# Patient Record
Sex: Female | Born: 1980
Health system: Southern US, Community
[De-identification: ages and names within clinical notes are randomized; demographics above are authoritative.]

## PROBLEM LIST (undated history)

## (undated) DIAGNOSIS — I471 Supraventricular tachycardia, unspecified: Secondary | ICD-10-CM

## (undated) DIAGNOSIS — G8929 Other chronic pain: Secondary | ICD-10-CM

## (undated) DIAGNOSIS — I10 Essential (primary) hypertension: Secondary | ICD-10-CM

## (undated) DIAGNOSIS — E282 Polycystic ovarian syndrome: Secondary | ICD-10-CM

## (undated) DIAGNOSIS — Z9889 Other specified postprocedural states: Secondary | ICD-10-CM

## (undated) DIAGNOSIS — R112 Nausea with vomiting, unspecified: Secondary | ICD-10-CM

## (undated) HISTORY — DX: Essential (primary) hypertension: I10

## (undated) HISTORY — DX: Other chronic pain: G89.29

---
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
## 2014-03-09 ENCOUNTER — Other Ambulatory Visit (HOSPITAL_COMMUNITY)
Admission: RE | Admit: 2014-03-09 | Discharge: 2014-03-09 | Disposition: A | Discharge status: Home / Self Care | Payer: BC Managed Care – PPO | Source: Ambulatory Visit | Attending: Obstetrics & Gynecology | Admitting: Obstetrics & Gynecology

## 2014-03-09 DIAGNOSIS — Z1151 Encounter for screening for human papillomavirus (HPV): Secondary | ICD-10-CM | POA: Insufficient documentation

## 2014-03-09 DIAGNOSIS — Z01419 Encounter for gynecological examination (general) (routine) without abnormal findings: Secondary | ICD-10-CM | POA: Insufficient documentation

## 2014-04-21 ENCOUNTER — Other Ambulatory Visit: Payer: Self-pay | Admitting: Family Medicine

## 2014-04-21 DIAGNOSIS — M545 Low back pain, unspecified: Secondary | ICD-10-CM

## 2014-05-01 ENCOUNTER — Other Ambulatory Visit: Payer: BC Managed Care – PPO

## 2014-05-08 ENCOUNTER — Ambulatory Visit
Admission: RE | Admit: 2014-05-08 | Discharge: 2014-05-08 | Disposition: A | Discharge status: Home / Self Care | Payer: BC Managed Care – PPO | Source: Ambulatory Visit | Attending: Family Medicine | Admitting: Family Medicine

## 2014-05-08 DIAGNOSIS — M545 Low back pain, unspecified: Secondary | ICD-10-CM

## 2019-07-13 ENCOUNTER — Other Ambulatory Visit: Payer: Self-pay | Admitting: Obstetrics & Gynecology

## 2019-07-13 DIAGNOSIS — Z1231 Encounter for screening mammogram for malignant neoplasm of breast: Secondary | ICD-10-CM

## 2019-08-30 ENCOUNTER — Other Ambulatory Visit: Payer: Self-pay

## 2019-08-30 ENCOUNTER — Ambulatory Visit
Admission: RE | Admit: 2019-08-30 | Discharge: 2019-08-30 | Disposition: A | Discharge status: Home / Self Care | Payer: BC Managed Care – PPO | Source: Ambulatory Visit | Attending: Obstetrics & Gynecology | Admitting: Obstetrics & Gynecology

## 2019-08-30 DIAGNOSIS — Z1231 Encounter for screening mammogram for malignant neoplasm of breast: Secondary | ICD-10-CM

## 2019-08-31 ENCOUNTER — Other Ambulatory Visit: Payer: Self-pay | Admitting: Obstetrics & Gynecology

## 2019-08-31 DIAGNOSIS — R928 Other abnormal and inconclusive findings on diagnostic imaging of breast: Secondary | ICD-10-CM

## 2019-09-02 ENCOUNTER — Ambulatory Visit
Admission: RE | Admit: 2019-09-02 | Discharge: 2019-09-02 | Disposition: A | Discharge status: Home / Self Care | Payer: BC Managed Care – PPO | Source: Ambulatory Visit | Attending: Obstetrics & Gynecology | Admitting: Obstetrics & Gynecology

## 2019-09-02 ENCOUNTER — Other Ambulatory Visit: Payer: Self-pay | Admitting: Obstetrics & Gynecology

## 2019-09-02 ENCOUNTER — Other Ambulatory Visit: Payer: Self-pay

## 2019-09-02 DIAGNOSIS — R928 Other abnormal and inconclusive findings on diagnostic imaging of breast: Secondary | ICD-10-CM

## 2019-09-02 DIAGNOSIS — N6489 Other specified disorders of breast: Secondary | ICD-10-CM

## 2019-09-07 ENCOUNTER — Ambulatory Visit
Admission: RE | Admit: 2019-09-07 | Discharge: 2019-09-07 | Disposition: A | Discharge status: Home / Self Care | Payer: BC Managed Care – PPO | Source: Ambulatory Visit | Attending: Obstetrics & Gynecology | Admitting: Obstetrics & Gynecology

## 2019-09-07 ENCOUNTER — Other Ambulatory Visit: Payer: Self-pay

## 2019-09-07 DIAGNOSIS — N6489 Other specified disorders of breast: Secondary | ICD-10-CM

## 2019-09-07 HISTORY — PX: BREAST BIOPSY: SHX20

## 2019-10-26 ENCOUNTER — Other Ambulatory Visit: Payer: Self-pay | Admitting: General Surgery

## 2019-10-26 DIAGNOSIS — N6489 Other specified disorders of breast: Secondary | ICD-10-CM

## 2019-10-27 ENCOUNTER — Other Ambulatory Visit: Payer: Self-pay | Admitting: General Surgery

## 2019-10-27 DIAGNOSIS — N6489 Other specified disorders of breast: Secondary | ICD-10-CM

## 2019-11-22 ENCOUNTER — Encounter (HOSPITAL_BASED_OUTPATIENT_CLINIC_OR_DEPARTMENT_OTHER): Payer: Self-pay | Admitting: General Surgery

## 2019-11-22 ENCOUNTER — Other Ambulatory Visit: Payer: Self-pay

## 2019-11-25 ENCOUNTER — Other Ambulatory Visit (HOSPITAL_COMMUNITY)
Admission: RE | Admit: 2019-11-25 | Discharge: 2019-11-25 | Disposition: A | Discharge status: Home / Self Care | Payer: BC Managed Care – PPO | Source: Ambulatory Visit | Attending: General Surgery | Admitting: General Surgery

## 2019-11-25 DIAGNOSIS — Z01812 Encounter for preprocedural laboratory examination: Secondary | ICD-10-CM | POA: Diagnosis present

## 2019-11-25 DIAGNOSIS — Z20822 Contact with and (suspected) exposure to covid-19: Secondary | ICD-10-CM | POA: Diagnosis not present

## 2019-11-25 LAB — SARS CORONAVIRUS 2 (TAT 6-24 HRS): SARS Coronavirus 2: NEGATIVE

## 2019-11-25 NOTE — Progress Notes (Signed)
Anesthesia consult with Dr. Okey Dupre. Ok to proceed with surgery as scheduled.

## 2019-11-25 NOTE — Progress Notes (Signed)
      Enhanced Recovery after Surgery for Orthopedics Enhanced Recovery after Surgery is a protocol used to improve the stress on your body and your recovery after surgery.  Patient Instructions  . The night before surgery:  o No food after midnight. ONLY clear liquids after midnight  . The day of surgery (if you do NOT have diabetes):  o Drink ONE (1) Pre-Surgery Clear Ensure as directed.   o This drink was given to you during your hospital  pre-op appointment visit. o The pre-op nurse will instruct you on the time to drink the  Pre-Surgery Ensure depending on your surgery time. o Finish the drink at the designated time by the pre-op nurse.  o Nothing else to drink after completing the  Pre-Surgery Clear Ensure.  . The day of surgery (if you have diabetes): o Drink ONE (1) Gatorade 2 (G2) as directed. o This drink was given to you during your hospital  pre-op appointment visit.  o The pre-op nurse will instruct you on the time to drink the   Gatorade 2 (G2) depending on your surgery time. o Color of the Gatorade may vary. Red is not allowed. o Nothing else to drink after completing the  Gatorade 2 (G2).         If you have questions, please contact your surgeon's office.  Hibiclens given to pt with instructions. 

## 2019-11-28 ENCOUNTER — Other Ambulatory Visit: Payer: Self-pay

## 2019-11-28 ENCOUNTER — Ambulatory Visit
Admission: RE | Admit: 2019-11-28 | Discharge: 2019-11-28 | Disposition: A | Discharge status: Home / Self Care | Payer: BC Managed Care – PPO | Source: Ambulatory Visit | Attending: General Surgery | Admitting: General Surgery

## 2019-11-28 DIAGNOSIS — N6489 Other specified disorders of breast: Secondary | ICD-10-CM

## 2019-11-29 ENCOUNTER — Encounter (HOSPITAL_BASED_OUTPATIENT_CLINIC_OR_DEPARTMENT_OTHER)
Admission: RE | Disposition: A | Discharge status: Home / Self Care | Payer: Self-pay | Source: Home / Self Care | Attending: General Surgery

## 2019-11-29 ENCOUNTER — Ambulatory Visit (HOSPITAL_BASED_OUTPATIENT_CLINIC_OR_DEPARTMENT_OTHER): Payer: BC Managed Care – PPO | Admitting: Anesthesiology

## 2019-11-29 ENCOUNTER — Ambulatory Visit
Admission: RE | Admit: 2019-11-29 | Discharge: 2019-11-29 | Disposition: A | Discharge status: Home / Self Care | Payer: BC Managed Care – PPO | Source: Ambulatory Visit | Attending: General Surgery | Admitting: General Surgery

## 2019-11-29 ENCOUNTER — Ambulatory Visit (HOSPITAL_BASED_OUTPATIENT_CLINIC_OR_DEPARTMENT_OTHER)
Admission: RE | Admit: 2019-11-29 | Discharge: 2019-11-29 | Disposition: A | Discharge status: Home / Self Care | Payer: BC Managed Care – PPO | Attending: General Surgery | Admitting: General Surgery

## 2019-11-29 ENCOUNTER — Encounter (HOSPITAL_BASED_OUTPATIENT_CLINIC_OR_DEPARTMENT_OTHER): Payer: Self-pay | Admitting: General Surgery

## 2019-11-29 ENCOUNTER — Other Ambulatory Visit: Payer: Self-pay

## 2019-11-29 DIAGNOSIS — N6489 Other specified disorders of breast: Secondary | ICD-10-CM | POA: Insufficient documentation

## 2019-11-29 DIAGNOSIS — Z6841 Body Mass Index (BMI) 40.0 and over, adult: Secondary | ICD-10-CM | POA: Diagnosis not present

## 2019-11-29 HISTORY — DX: Supraventricular tachycardia, unspecified: I47.10

## 2019-11-29 HISTORY — DX: Other specified postprocedural states: R11.2

## 2019-11-29 HISTORY — DX: Supraventricular tachycardia: I47.1

## 2019-11-29 HISTORY — DX: Polycystic ovarian syndrome: E28.2

## 2019-11-29 HISTORY — PX: BREAST EXCISIONAL BIOPSY: SUR124

## 2019-11-29 HISTORY — DX: Other specified postprocedural states: Z98.890

## 2019-11-29 HISTORY — PX: RADIOACTIVE SEED GUIDED EXCISIONAL BREAST BIOPSY: SHX6490

## 2019-11-29 LAB — POCT PREGNANCY, URINE: Preg Test, Ur: NEGATIVE

## 2019-11-29 SURGERY — RADIOACTIVE SEED GUIDED BREAST BIOPSY
Anesthesia: General | Site: Breast | Laterality: Right

## 2019-11-29 MED ORDER — KETOROLAC TROMETHAMINE 15 MG/ML IJ SOLN
INTRAMUSCULAR | Status: AC
  Filled 2019-11-29: qty 1

## 2019-11-29 MED ORDER — FENTANYL CITRATE (PF) 100 MCG/2ML IJ SOLN
INTRAMUSCULAR | Status: AC
  Filled 2019-11-29: qty 2

## 2019-11-29 MED ORDER — ENSURE PRE-SURGERY PO LIQD: 296.0000 mL | Freq: Once | ORAL | Status: DC

## 2019-11-29 MED ORDER — TRAMADOL HCL 50 MG PO TABS: 50.0000 mg | ORAL_TABLET | Freq: Four times a day (QID) | ORAL | 0 refills | Status: DC | PRN

## 2019-11-29 MED ORDER — CEFAZOLIN SODIUM-DEXTROSE 2-4 GM/100ML-% IV SOLN
2.0000 g | INTRAVENOUS | Status: AC
  Administered 2019-11-29: 3 g via INTRAVENOUS

## 2019-11-29 MED ORDER — HYDROMORPHONE HCL 1 MG/ML IJ SOLN
INTRAMUSCULAR | Status: AC
  Filled 2019-11-29: qty 0.5

## 2019-11-29 MED ORDER — DEXAMETHASONE SODIUM PHOSPHATE 10 MG/ML IJ SOLN
INTRAMUSCULAR | Status: AC
  Filled 2019-11-29: qty 1

## 2019-11-29 MED ORDER — OXYCODONE HCL 5 MG PO TABS: 5.0000 mg | ORAL_TABLET | Freq: Once | ORAL | Status: DC | PRN

## 2019-11-29 MED ORDER — KETOROLAC TROMETHAMINE 15 MG/ML IJ SOLN
15.0000 mg | INTRAMUSCULAR | Status: AC
  Administered 2019-11-29: 15 mg via INTRAVENOUS

## 2019-11-29 MED ORDER — ACETAMINOPHEN 500 MG PO TABS
1000.0000 mg | ORAL_TABLET | ORAL | Status: AC
  Administered 2019-11-29: 1000 mg via ORAL

## 2019-11-29 MED ORDER — LIDOCAINE 2% (20 MG/ML) 5 ML SYRINGE
INTRAMUSCULAR | Status: AC
  Filled 2019-11-29: qty 5

## 2019-11-29 MED ORDER — PROMETHAZINE HCL 25 MG/ML IJ SOLN: 6.2500 mg | INTRAMUSCULAR | Status: DC | PRN

## 2019-11-29 MED ORDER — LIDOCAINE 2% (20 MG/ML) 5 ML SYRINGE
INTRAMUSCULAR | Status: DC | PRN
  Administered 2019-11-29: 80 mg via INTRAVENOUS

## 2019-11-29 MED ORDER — ONDANSETRON HCL 4 MG/2ML IJ SOLN
INTRAMUSCULAR | Status: AC
  Filled 2019-11-29: qty 2

## 2019-11-29 MED ORDER — GABAPENTIN 100 MG PO CAPS
100.0000 mg | ORAL_CAPSULE | ORAL | Status: AC
  Administered 2019-11-29: 100 mg via ORAL

## 2019-11-29 MED ORDER — CEFAZOLIN SODIUM-DEXTROSE 1-4 GM/50ML-% IV SOLN
INTRAVENOUS | Status: AC
  Filled 2019-11-29: qty 50

## 2019-11-29 MED ORDER — METHYLENE BLUE 0.5 % INJ SOLN
INTRAVENOUS | Status: AC
  Filled 2019-11-29: qty 20

## 2019-11-29 MED ORDER — BUPIVACAINE HCL (PF) 0.25 % IJ SOLN
INTRAMUSCULAR | Status: DC | PRN
  Administered 2019-11-29: 14 mL

## 2019-11-29 MED ORDER — SODIUM CHLORIDE (PF) 0.9 % IJ SOLN
INTRAMUSCULAR | Status: AC
  Filled 2019-11-29: qty 20

## 2019-11-29 MED ORDER — TRAMADOL HCL 50 MG PO TABS
ORAL_TABLET | ORAL | Status: AC
  Filled 2019-11-29: qty 1

## 2019-11-29 MED ORDER — DEXAMETHASONE SODIUM PHOSPHATE 10 MG/ML IJ SOLN
INTRAMUSCULAR | Status: DC | PRN
  Administered 2019-11-29: 4 mg via INTRAVENOUS

## 2019-11-29 MED ORDER — PROPOFOL 10 MG/ML IV BOLUS
INTRAVENOUS | Status: DC | PRN
  Administered 2019-11-29: 200 mg via INTRAVENOUS

## 2019-11-29 MED ORDER — EPHEDRINE SULFATE 50 MG/ML IJ SOLN
INTRAMUSCULAR | Status: DC | PRN
  Administered 2019-11-29: 10 mg via INTRAVENOUS
  Administered 2019-11-29: 15 mg via INTRAVENOUS

## 2019-11-29 MED ORDER — MIDAZOLAM HCL 5 MG/5ML IJ SOLN
INTRAMUSCULAR | Status: DC | PRN
  Administered 2019-11-29: 2 mg via INTRAVENOUS

## 2019-11-29 MED ORDER — FENTANYL CITRATE (PF) 250 MCG/5ML IJ SOLN
INTRAMUSCULAR | Status: DC | PRN
  Administered 2019-11-29 (×2): 25 ug via INTRAVENOUS

## 2019-11-29 MED ORDER — MIDAZOLAM HCL 2 MG/2ML IJ SOLN
INTRAMUSCULAR | Status: AC
  Filled 2019-11-29: qty 2

## 2019-11-29 MED ORDER — LACTATED RINGERS IV SOLN: INTRAVENOUS | Status: DC

## 2019-11-29 MED ORDER — ONDANSETRON HCL 4 MG/2ML IJ SOLN
INTRAMUSCULAR | Status: DC | PRN
  Administered 2019-11-29: 4 mg via INTRAVENOUS

## 2019-11-29 MED ORDER — TRAMADOL HCL 50 MG PO TABS
50.0000 mg | ORAL_TABLET | Freq: Once | ORAL | Status: AC
  Administered 2019-11-29: 11:00:00 50 mg via ORAL

## 2019-11-29 MED ORDER — CEFAZOLIN SODIUM-DEXTROSE 2-4 GM/100ML-% IV SOLN
INTRAVENOUS | Status: AC
  Filled 2019-11-29: qty 100

## 2019-11-29 MED ORDER — OXYCODONE HCL 5 MG/5ML PO SOLN: 5.0000 mg | Freq: Once | ORAL | Status: DC | PRN

## 2019-11-29 MED ORDER — BUPIVACAINE HCL (PF) 0.25 % IJ SOLN
INTRAMUSCULAR | Status: AC
  Filled 2019-11-29: qty 180

## 2019-11-29 MED ORDER — PROPOFOL 10 MG/ML IV BOLUS
INTRAVENOUS | Status: AC
  Filled 2019-11-29: qty 20

## 2019-11-29 MED ORDER — ACETAMINOPHEN 500 MG PO TABS
ORAL_TABLET | ORAL | Status: AC
  Filled 2019-11-29: qty 2

## 2019-11-29 MED ORDER — HYDROMORPHONE HCL 1 MG/ML IJ SOLN
0.2500 mg | INTRAMUSCULAR | Status: DC | PRN
  Administered 2019-11-29: 10:00:00 0.5 mg via INTRAVENOUS

## 2019-11-29 MED ORDER — GABAPENTIN 100 MG PO CAPS
ORAL_CAPSULE | ORAL | Status: AC
  Filled 2019-11-29: qty 1

## 2019-11-29 SURGICAL SUPPLY — 53 items
BINDER BREAST 3XL (GAUZE/BANDAGES/DRESSINGS) ×3 IMPLANT
BINDER BREAST XXLRG (GAUZE/BANDAGES/DRESSINGS) IMPLANT
BLADE SURG 15 STRL LF DISP TIS (BLADE) ×1 IMPLANT
BLADE SURG 15 STRL SS (BLADE) ×2
CANISTER SUC SOCK COL 7IN (MISCELLANEOUS) IMPLANT
CANISTER SUCT 1200ML W/VALVE (MISCELLANEOUS) IMPLANT
CHLORAPREP W/TINT 26 (MISCELLANEOUS) ×3 IMPLANT
CLIP VESOCCLUDE SM WIDE 6/CT (CLIP) ×3 IMPLANT
CLOSURE WOUND 1/2 X4 (GAUZE/BANDAGES/DRESSINGS) ×1
COVER BACK TABLE 60X90IN (DRAPES) ×3 IMPLANT
COVER MAYO STAND STRL (DRAPES) ×3 IMPLANT
COVER PROBE W GEL 5X96 (DRAPES) ×3 IMPLANT
DERMABOND ADVANCED (GAUZE/BANDAGES/DRESSINGS) ×2
DERMABOND ADVANCED .7 DNX12 (GAUZE/BANDAGES/DRESSINGS) ×1 IMPLANT
DRAPE LAPAROSCOPIC ABDOMINAL (DRAPES) ×3 IMPLANT
DRAPE UTILITY XL STRL (DRAPES) ×3 IMPLANT
ELECT COATED BLADE 2.86 ST (ELECTRODE) ×3 IMPLANT
ELECT REM PT RETURN 9FT ADLT (ELECTROSURGICAL) ×3
ELECTRODE REM PT RTRN 9FT ADLT (ELECTROSURGICAL) ×1 IMPLANT
GLOVE BIO SURGEON STRL SZ7 (GLOVE) ×9 IMPLANT
GLOVE BIO SURGEON STRL SZ7.5 (GLOVE) ×3 IMPLANT
GLOVE BIOGEL PI IND STRL 7.0 (GLOVE) ×1 IMPLANT
GLOVE BIOGEL PI IND STRL 7.5 (GLOVE) ×2 IMPLANT
GLOVE BIOGEL PI IND STRL 8 (GLOVE) ×1 IMPLANT
GLOVE BIOGEL PI INDICATOR 7.0 (GLOVE) ×2
GLOVE BIOGEL PI INDICATOR 7.5 (GLOVE) ×4
GLOVE BIOGEL PI INDICATOR 8 (GLOVE) ×2
GLOVE SURG SS PI 6.5 STRL IVOR (GLOVE) ×3 IMPLANT
GOWN STRL REUS W/ TWL LRG LVL3 (GOWN DISPOSABLE) ×2 IMPLANT
GOWN STRL REUS W/TWL LRG LVL3 (GOWN DISPOSABLE) ×4
HEMOSTAT ARISTA ABSORB 3G PWDR (HEMOSTASIS) IMPLANT
KIT MARKER MARGIN INK (KITS) ×3 IMPLANT
NEEDLE HYPO 25X1 1.5 SAFETY (NEEDLE) ×3 IMPLANT
NS IRRIG 1000ML POUR BTL (IV SOLUTION) ×3 IMPLANT
PACK BASIN DAY SURGERY FS (CUSTOM PROCEDURE TRAY) ×3 IMPLANT
PENCIL SMOKE EVACUATOR (MISCELLANEOUS) ×3 IMPLANT
RETRACTOR ONETRAX LX 90X20 (MISCELLANEOUS) ×3 IMPLANT
SLEEVE SCD COMPRESS KNEE MED (MISCELLANEOUS) ×3 IMPLANT
SPONGE LAP 4X18 RFD (DISPOSABLE) ×6 IMPLANT
STRIP CLOSURE SKIN 1/2X4 (GAUZE/BANDAGES/DRESSINGS) ×2 IMPLANT
SUT MNCRL AB 4-0 PS2 18 (SUTURE) ×3 IMPLANT
SUT MON AB 5-0 PS2 18 (SUTURE) IMPLANT
SUT SILK 2 0 SH (SUTURE) IMPLANT
SUT VIC AB 2-0 SH 27 (SUTURE) ×2
SUT VIC AB 2-0 SH 27XBRD (SUTURE) ×1 IMPLANT
SUT VIC AB 3-0 SH 27 (SUTURE) ×2
SUT VIC AB 3-0 SH 27X BRD (SUTURE) ×1 IMPLANT
SYR CONTROL 10ML LL (SYRINGE) ×3 IMPLANT
TOWEL GREEN STERILE FF (TOWEL DISPOSABLE) ×3 IMPLANT
TRAY FAXITRON CT DISP (TRAY / TRAY PROCEDURE) ×3 IMPLANT
TUBE CONNECTING 20'X1/4 (TUBING) ×1
TUBE CONNECTING 20X1/4 (TUBING) ×2 IMPLANT
YANKAUER SUCT BULB TIP NO VENT (SUCTIONS) ×3 IMPLANT

## 2019-11-29 NOTE — Anesthesia Postprocedure Evaluation (Signed)
Anesthesia Post Note  Patient: Regina Carlson  Procedure(s) Performed: RADIOACTIVE SEED GUIDED EXCISIONAL RIGHT BREAST BIOPSY (Right Breast)     Patient location during evaluation: PACU Anesthesia Type: General Level of consciousness: awake and alert Pain management: pain level controlled Vital Signs Assessment: post-procedure vital signs reviewed and stable Respiratory status: spontaneous breathing, nonlabored ventilation, respiratory function stable and patient connected to nasal cannula oxygen Cardiovascular status: blood pressure returned to baseline and stable Postop Assessment: no apparent nausea or vomiting Anesthetic complications: no    Last Vitals:  Vitals:   11/29/19 1024 11/29/19 1045  BP: 139/79 (!) 143/83  Pulse:  76  Resp:  16  Temp:  37.1 C  SpO2:  100%    Last Pain:  Vitals:   11/29/19 1008  TempSrc:   PainSc: 5                  Ryan P Ellender

## 2019-11-29 NOTE — Interval H&P Note (Signed)
History and Physical Interval Note:  11/29/2019 8:09 AM  Regina Carlson  has presented today for surgery, with the diagnosis of RIGHT BREAST ABNORMAL MAMMOGRAM.  The various methods of treatment have been discussed with the patient and family. After consideration of risks, benefits and other options for treatment, the patient has consented to  Procedure(s): RADIOACTIVE SEED GUIDED EXCISIONAL RIGHT BREAST BIOPSY (Right) as a surgical intervention.  The patient's history has been reviewed, patient examined, no change in status, stable for surgery.  I have reviewed the patient's chart and labs.  Questions were answered to the patient's satisfaction.     Emelia Loron

## 2019-11-29 NOTE — Anesthesia Procedure Notes (Signed)
Procedure Name: LMA Insertion Date/Time: 11/29/2019 8:32 AM Performed by: Lucinda Dell, CRNA Pre-anesthesia Checklist: Patient identified, Emergency Drugs available, Suction available and Patient being monitored Patient Re-evaluated:Patient Re-evaluated prior to induction Oxygen Delivery Method: Circle system utilized Preoxygenation: Pre-oxygenation with 100% oxygen Induction Type: IV induction Ventilation: Mask ventilation without difficulty LMA: LMA with gastric port inserted Tube type: Oral Number of attempts: 1 Placement Confirmation: positive ETCO2 and breath sounds checked- equal and bilateral Tube secured with: Tape Dental Injury: Teeth and Oropharynx as per pre-operative assessment

## 2019-11-29 NOTE — Op Note (Signed)
Preoperative diagnosis: Right breast mammographic abnormality with core biopsy showing radial scar Postoperative diagnosis: Same as above Procedure: Right breast radioactive seed guided excisional biopsy Surgeon: Dr. Harden Mo Anesthesia: General estimated blood loss: 20 cc Specimens: Right breast tissue marked with paint containing seed and clip Drains none Complications none Sponge needle count was correct at completion Disposition to recovery in stable condition  Indications: This is a 39 year old female underwent a screening mammogram.  She was found to be density breast and a distortion on the right side.  This underwent biopsy and is a radial scar.  We discussed her options and we elected to proceed with excision using seed guidance.  Procedure: After informed consent was obtained the patient was first given antibiotics.  SCDs were placed.  She was placed under general anesthesia without complication.  She was prepped and draped in the standard sterile surgical fashion.  Surgical timeout was then performed.  I had the mammograms available for my review in the operating room.  I confirmed the seed was present prior to beginning.  I then elected to make an inframammary incision in order to hide the scar later.  I infiltrated this with Marcaine.  I then made an incision and used a lighted retractor to tunnel to the lesion.  I then remove the seed and some of the surrounding tissue.  I then marked this with paint.  I passed this off the table and mammogram confirmed removal of the seed and the clip.  I then obtained hemostasis.  I closed the breast tissue with 2-0 Vicryl suture.  I then closed the skin with 3-0 Vicryl and 4-0 Monocryl.  Glue and Steri-Strips were applied.  She tolerated this well was extubated and transferred to recovery stable.

## 2019-11-29 NOTE — H&P (Signed)
39 yof referred by Dr Nelda Marseille for right breast mammographic finding. she has no personal history of breast disease. she has fh in pgm 70s and mgm who is deceased from bca in 27s. she has no mass or dc. underwent screening mm that shows b density breasts. she she negative left breast and has a distortion on right. this persisted on spot views and has no [place] correlate. biopsy was done with a csl found she was then referred for evaluation   Past Surgical History  Breast Biopsy  Right. Foot Surgery  Right.  Diagnostic Studies History  Colonoscopy  never Mammogram  within last year Pap Smear  1-5 years ago  Allergies  Penicillin G Benzathine & Proc *PENICILLINS*  Codeine Phosphate *ANALGESICS - OPIOID*  Allergies Reconciled   Medication History No Current Medications Medications Reconciled  Social History  Alcohol use  Occasional alcohol use. Caffeine use  Carbonated beverages. No drug use  Tobacco use  Never smoker.  Family History  Arthritis  Father. Colon Polyps  Father, Mother. Depression  Mother. Hypertension  Father, Mother. Melanoma  Mother. Prostate Cancer  Father. Thyroid problems  Father.  Pregnancy / Birth History Age at menarche  46 years. Contraceptive History  Contraceptive implant. Gravida  2 Irregular periods  Length (months) of breastfeeding  3-6 Maternal age  92-30 Para  2  Other Problems  Anxiety Disorder  Arthritis  Back Pain  Depression  Other disease, cancer, significant illness     Review of Systems  General Not Present- Appetite Loss, Chills, Fatigue, Fever, Night Sweats, Weight Gain and Weight Loss. Skin Not Present- Change in Wart/Mole, Dryness, Hives, Jaundice, New Lesions, Non-Healing Wounds, Rash and Ulcer. HEENT Not Present- Earache, Hearing Loss, Hoarseness, Nose Bleed, Oral Ulcers, Ringing in the Ears, Seasonal Allergies, Sinus Pain, Sore Throat, Visual Disturbances, Wears glasses/contact lenses and  Yellow Eyes. Respiratory Not Present- Bloody sputum, Chronic Cough, Difficulty Breathing, Snoring and Wheezing. Breast Not Present- Breast Mass, Breast Pain, Nipple Discharge and Skin Changes. Cardiovascular Not Present- Chest Pain, Difficulty Breathing Lying Down, Leg Cramps, Palpitations, Rapid Heart Rate, Shortness of Breath and Swelling of Extremities. Gastrointestinal Not Present- Abdominal Pain, Bloating, Bloody Stool, Change in Bowel Habits, Chronic diarrhea, Constipation, Difficulty Swallowing, Excessive gas, Gets full quickly at meals, Hemorrhoids, Indigestion, Nausea, Rectal Pain and Vomiting. Female Genitourinary Not Present- Frequency, Nocturia, Painful Urination, Pelvic Pain and Urgency. Musculoskeletal Not Present- Back Pain, Joint Pain, Joint Stiffness, Muscle Pain, Muscle Weakness and Swelling of Extremities. Neurological Not Present- Decreased Memory, Fainting, Headaches, Numbness, Seizures, Tingling, Tremor, Trouble walking and Weakness. Psychiatric Not Present- Anxiety, Bipolar, Change in Sleep Pattern, Depression, Fearful and Frequent crying. Endocrine Not Present- Cold Intolerance, Excessive Hunger, Hair Changes, Heat Intolerance, Hot flashes and New Diabetes. Hematology Not Present- Blood Thinners, Easy Bruising, Excessive bleeding, Gland problems, HIV and Persistent Infections.    Physical Exam  General Mental Status-Alert. Orientation-Oriented X3. Head and Neck Trachea-midline. Eye Sclera/Conjunctiva - Bilateral-No scleral icterus. Chest and Lung Exam Chest and lung exam reveals -quiet, even and easy respiratory effort with no use of accessory muscles. Breast Nipples-No Discharge. Breast Lump-No Palpable Breast Mass. Neurologic Neurologic evaluation reveals -alert and oriented x 3 with no impairment of recent or remote memory. Lymphatic Head & Neck General Head & Neck Lymphatics: Bilateral - Description - Normal. Axillary General Axillary  Region: Bilateral - Description - Normal. Note: no Ismay adenopathy   Assessment & Plan RADIAL SCAR OF RIGHT BREAST (O67.12) Story: Right breast seed guided excisional biopsy discussed  options of observation vs excision. discussed local rate of atypia/dcis/cancer on excision about 15-18% with csl on core and distortion. with fh and young age would like to proceed with excision. discussed seed guided excision with risks, recovery, will proceed

## 2019-11-29 NOTE — Anesthesia Preprocedure Evaluation (Addendum)
Anesthesia Evaluation  Patient identified by MRN, date of birth, ID band Patient awake    Reviewed: Allergy & Precautions, NPO status , Patient's Chart, lab work & pertinent test results  History of Anesthesia Complications (+) PONV and history of anesthetic complications  Airway Mallampati: I  TM Distance: >3 FB Neck ROM: Full    Dental no notable dental hx.    Pulmonary neg pulmonary ROS,    Pulmonary exam normal breath sounds clear to auscultation       Cardiovascular negative cardio ROS Normal cardiovascular exam Rhythm:Regular Rate:Normal     Neuro/Psych negative neurological ROS  negative psych ROS   GI/Hepatic negative GI ROS, Neg liver ROS,   Endo/Other  Morbid obesity (SUPER)PCOS (polycystic ovarian syndrome)  Renal/GU negative Renal ROS     Musculoskeletal negative musculoskeletal ROS (+)   Abdominal   Peds  Hematology negative hematology ROS (+)   Anesthesia Other Findings RIGHT BREAST ABNORMAL MAMMOGRAM  Reproductive/Obstetrics                            Anesthesia Physical Anesthesia Plan  ASA: IV  Anesthesia Plan: General   Post-op Pain Management:    Induction: Intravenous  PONV Risk Score and Plan: 4 or greater and Ondansetron, Treatment may vary due to age or medical condition, Dexamethasone and Midazolam  Airway Management Planned: LMA  Additional Equipment:   Intra-op Plan:   Post-operative Plan: Extubation in OR  Informed Consent: I have reviewed the patients History and Physical, chart, labs and discussed the procedure including the risks, benefits and alternatives for the proposed anesthesia with the patient or authorized representative who has indicated his/her understanding and acceptance.     Dental advisory given  Plan Discussed with: CRNA  Anesthesia Plan Comments:        Anesthesia Quick Evaluation

## 2019-11-29 NOTE — Transfer of Care (Signed)
Immediate Anesthesia Transfer of Care Note  Patient: Chassidy Layson  Procedure(s) Performed: RADIOACTIVE SEED GUIDED EXCISIONAL RIGHT BREAST BIOPSY (Right Breast)  Patient Location: PACU  Anesthesia Type:General  Level of Consciousness: awake, alert , oriented and patient cooperative  Airway & Oxygen Therapy: Patient Spontanous Breathing and Patient connected to face mask oxygen  Post-op Assessment: Report given to RN, Post -op Vital signs reviewed and stable and Patient moving all extremities  Post vital signs: Reviewed and stable  Last Vitals:  Vitals Value Taken Time  BP 134/89 11/29/19 0932  Temp    Pulse 98 11/29/19 0934  Resp 20 11/29/19 0934  SpO2 100 % 11/29/19 0934  Vitals shown include unvalidated device data.  Last Pain:  Vitals:   11/29/19 0716  TempSrc: Tympanic  PainSc: 0-No pain         Complications: No apparent anesthesia complications

## 2019-11-29 NOTE — Discharge Instructions (Signed)
[facility] Office Phone Number [phone]  BREAST BIOPSY/ PARTIAL MASTECTOMY: POST OP INSTRUCTIONS Take 400 mg of ibuprofen every 8 hours or 650 mg tylenol every 6 hours for next 72 hours then as needed. Use ice several times daily also. Always review your discharge instruction sheet given to you by the facility where your surgery was performed.  IF YOU HAVE DISABILITY OR FAMILY LEAVE FORMS, YOU MUST BRING THEM TO THE OFFICE FOR PROCESSING.  DO NOT GIVE THEM TO YOUR DOCTOR.  1. A prescription for pain medication may be given to you upon discharge.  Take your pain medication as prescribed, if needed.  If narcotic pain medicine is not needed, then you may take acetaminophen (Tylenol), naprosyn (Alleve) or ibuprofen (Advil) as needed. 2. Take your usually prescribed medications unless otherwise directed 3. If you need a refill on your pain medication, please contact your pharmacy.  They will contact our office to request authorization.  Prescriptions will not be filled after 5pm or on week-ends. 4. You should eat very light the first 24 hours after surgery, such as soup, crackers, pudding, etc.  Resume your normal diet the day after surgery. 5. Most patients will experience some swelling and bruising in the breast.  Ice packs and a good support bra will help.  Wear the breast binder provided or a sports bra for 72 hours day and night.  After that wear a sports bra during the day until you return to the office. Swelling and bruising can take several days to resolve.  6. It is common to experience some constipation if taking pain medication after surgery.  Increasing fluid intake and taking a stool softener will usually help or prevent this problem from occurring.  A mild laxative (Milk of Magnesia or Miralax) should be taken according to package directions if there are no bowel movements after 48 hours. 7. Unless discharge instructions indicate otherwise, you may remove your bandages 48  hours after surgery and you may shower at that time.  You may have steri-strips (small skin tapes) in place directly over the incision.  These strips should be left on the skin for 7-10 days and will come off on their own.  If your surgeon used skin glue on the incision, you may shower in 24 hours.  The glue will flake off over the next 2-3 weeks.  Any sutures or staples will be removed at the office during your follow-up visit. 8. ACTIVITIES:  You may resume regular daily activities (gradually increasing) beginning the next day.  Wearing a good support bra or sports bra minimizes pain and swelling.  You may have sexual intercourse when it is comfortable. a. You may drive when you no longer are taking prescription pain medication, you can comfortably wear a seatbelt, and you can safely maneuver your car and apply brakes. b. RETURN TO WORK:  ______________________________________________________________________________________ 9. You should see your doctor in the office for a follow-up appointment approximately two weeks after your surgery.  Your doctor's nurse will typically make your follow-up appointment when she calls you with your pathology report.  Expect your pathology report 3-4 business days after your surgery.  You may call to check if you do not hear from us after three days. 10. OTHER INSTRUCTIONS: _______________________________________________________________________________________________ _____________________________________________________________________________________________________________________________________ _____________________________________________________________________________________________________________________________________ _____________________________________________________________________________________________________________________________________  WHEN TO CALL DR WAKEFIELD: 1. Fever over 101.0 2. Nausea and/or vomiting. 3. Extreme swelling or  bruising. 4. Continued bleeding from incision. 5. Increased pain, redness, or drainage from the incision.  The clinic   staff is available to answer your questions during regular business hours.  Please don't hesitate to call and ask to speak to one of the nurses for clinical concerns.  If you have a medical emergency, go to the nearest emergency room or call 911.  A surgeon from [facility] is always on call at the hospital.  For further questions, please visit [email or web address]   Post Anesthesia Home Care Instructions  Activity: Get plenty of rest for the remainder of the day. A responsible individual must stay with you for 24 hours following the procedure.  For the next 24 hours, DO NOT: -Drive a car -Operate machinery -Drink alcoholic beverages -Take any medication unless instructed by your physician -Make any legal decisions or sign important papers.  Meals: Start with liquid foods such as gelatin or soup. Progress to regular foods as tolerated. Avoid greasy, spicy, heavy foods. If nausea and/or vomiting occur, drink only clear liquids until the nausea and/or vomiting subsides. Call your physician if vomiting continues.  Special Instructions/Symptoms: Your throat may feel dry or sore from the anesthesia or the breathing tube placed in your throat during surgery. If this causes discomfort, gargle with warm salt water. The discomfort should disappear within 24 hours.  If you had a scopolamine patch placed behind your ear for the management of post- operative nausea and/or vomiting:  1. The medication in the patch is effective for 72 hours, after which it should be removed.  Wrap patch in a tissue and discard in the trash. Wash hands thoroughly with soap and water. 2. You may remove the patch earlier than 72 hours if you experience unpleasant side effects which may include dry mouth, dizziness or visual disturbances. 3. Avoid touching the patch. Wash your hands  with soap and water after contact with the patch.      

## 2019-11-30 ENCOUNTER — Encounter: Payer: Self-pay | Admitting: *Deleted

## 2019-11-30 LAB — SURGICAL PATHOLOGY

## 2019-12-16 ENCOUNTER — Ambulatory Visit: Payer: BC Managed Care – PPO | Attending: Internal Medicine

## 2019-12-16 DIAGNOSIS — Z23 Encounter for immunization: Secondary | ICD-10-CM | POA: Insufficient documentation

## 2019-12-16 NOTE — Progress Notes (Signed)
   Covid-19 Vaccination Clinic  Name:  Regina Carlson    MRN: [id number] DOB: [birth year]  12/16/2019  [name] was observed post Covid-19 immunization for 15 minutes without incident. She was provided with Vaccine Information Sheet and instruction to access the V-Safe system.   [name] was instructed to call 911 with any severe reactions post vaccine: Marland Kitchen Difficulty breathing  . Swelling of face and throat  . A fast heartbeat  . A bad rash all over body  . Dizziness and weakness   Immunizations Administered    Name Date Dose VIS Date Route   Pfizer COVID-19 Vaccine 12/16/2019  4:06 PM 0.3 mL 09/23/2019 Intramuscular   Manufacturer: [employer], [employer]   Lot: [id number]   NDC: [address]-4

## 2019-12-23 ENCOUNTER — Ambulatory Visit: Payer: BC Managed Care – PPO | Attending: Internal Medicine

## 2020-01-17 ENCOUNTER — Ambulatory Visit: Payer: BC Managed Care – PPO | Attending: Internal Medicine

## 2020-01-17 DIAGNOSIS — Z23 Encounter for immunization: Secondary | ICD-10-CM

## 2020-01-17 NOTE — Progress Notes (Signed)
   Covid-19 Vaccination Clinic  Name:  Regina Carlson    MRN: [id number] DOB: 1981-03-31  01/17/2020  [name] was observed post Covid-19 immunization for 15 minutes without incident. She was provided with Vaccine Information Sheet and instruction to access the V-Safe system.   [name] was instructed to call 911 with any severe reactions post vaccine: Marland Kitchen Difficulty breathing  . Swelling of face and throat  . A fast heartbeat  . A bad rash all over body  . Dizziness and weakness   Immunizations Administered    Name Date Dose VIS Date Route   Pfizer COVID-19 Vaccine 01/17/2020  9:58 AM 0.3 mL 09/23/2019 Intramuscular   Manufacturer: [employer], [employer]   Lot: [id number]   NDC: [address]-6

## 2020-07-27 ENCOUNTER — Encounter (HOSPITAL_COMMUNITY): Payer: Self-pay

## 2020-12-13 ENCOUNTER — Other Ambulatory Visit: Payer: Self-pay | Admitting: Obstetrics and Gynecology

## 2020-12-13 ENCOUNTER — Other Ambulatory Visit: Payer: Self-pay | Admitting: Obstetrics & Gynecology

## 2020-12-13 DIAGNOSIS — Z1231 Encounter for screening mammogram for malignant neoplasm of breast: Secondary | ICD-10-CM

## 2021-02-07 IMAGING — MG DIGITAL SCREENING BILAT W/ TOMO W/ CAD
8 series · 8 of 24 positions shown · non-contrast
Comparison: None.

CLINICAL DATA: Screening. Family history of breast cancer maternal
and paternal grandmothers. Baseline mammogram.

EXAM:
DIGITAL SCREENING BILATERAL MAMMOGRAM WITH TOMO AND CAD

[L CC synth-2D]
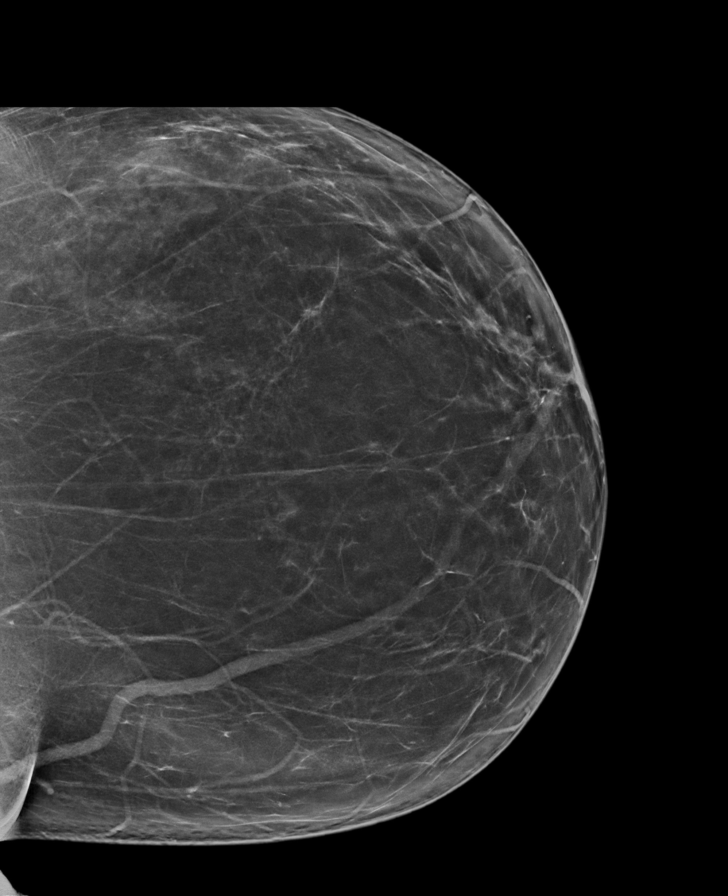

[R MLO synth-2D]
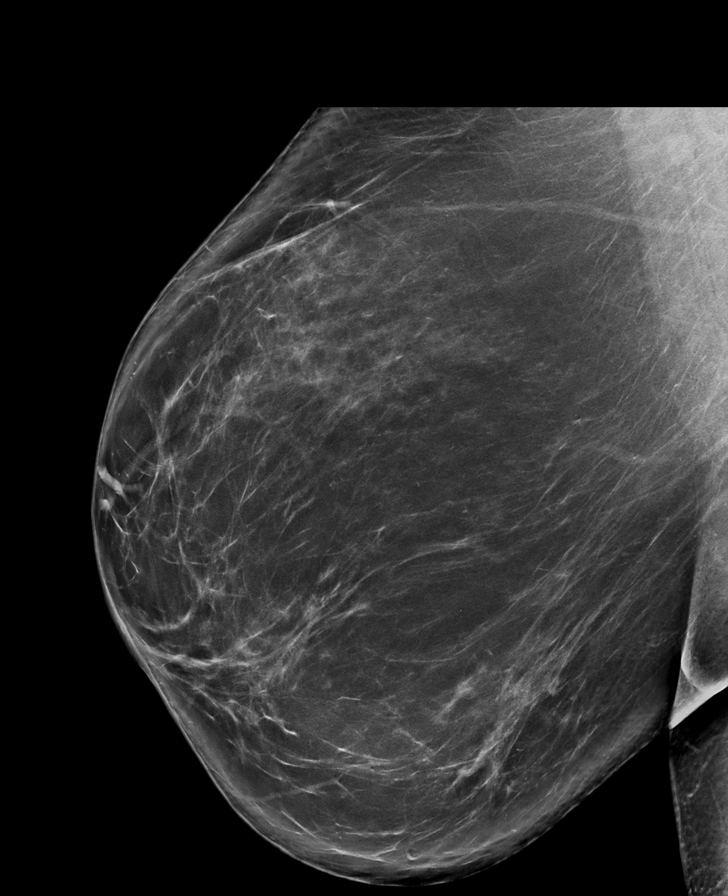

[L MLO synth-2D]
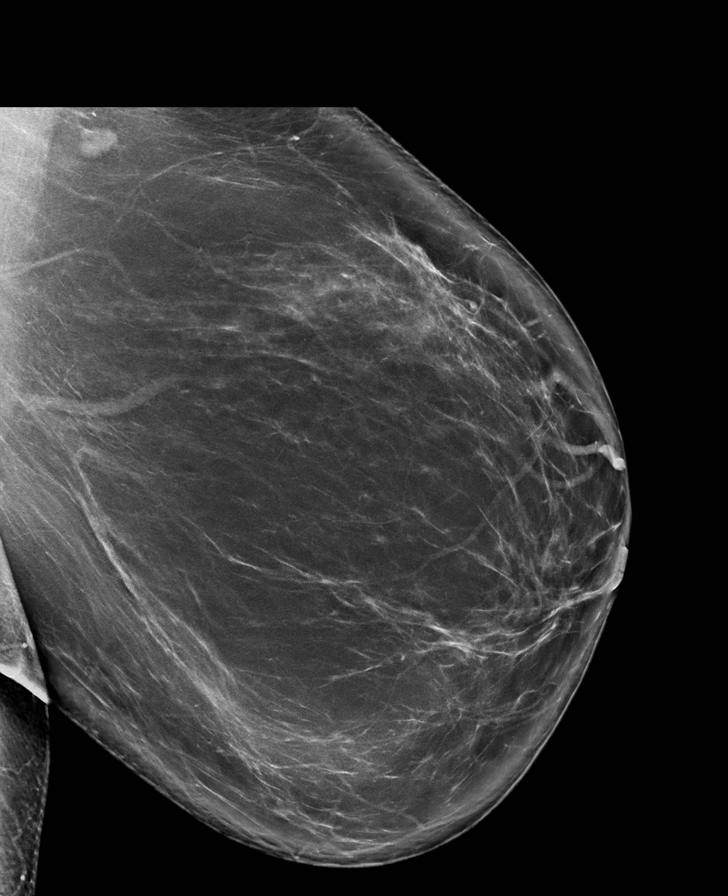

[R CC synth-2D]
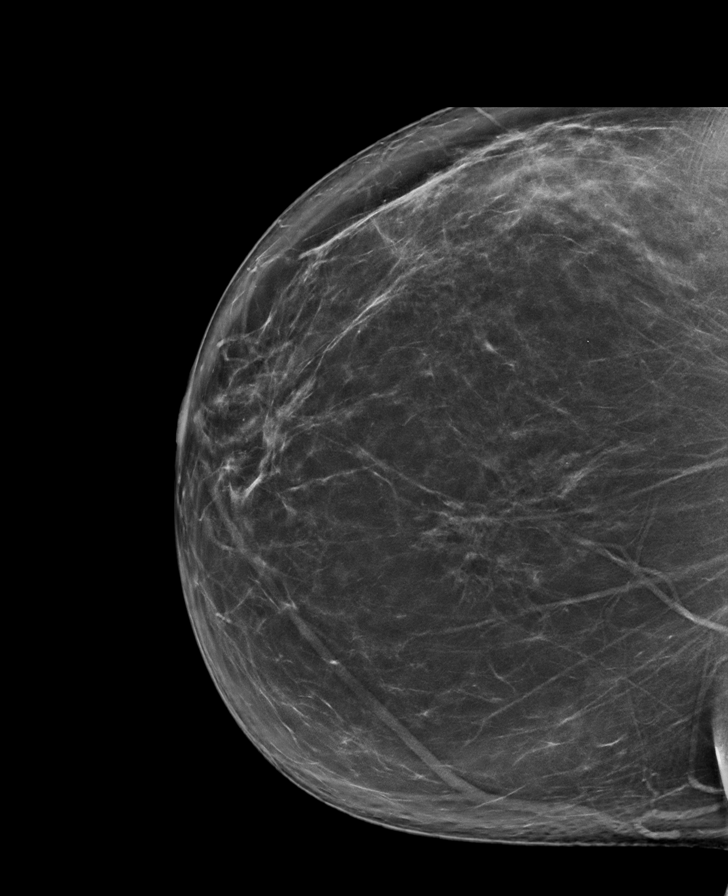

[L MLO tomo · tomo slice 53/106.0]
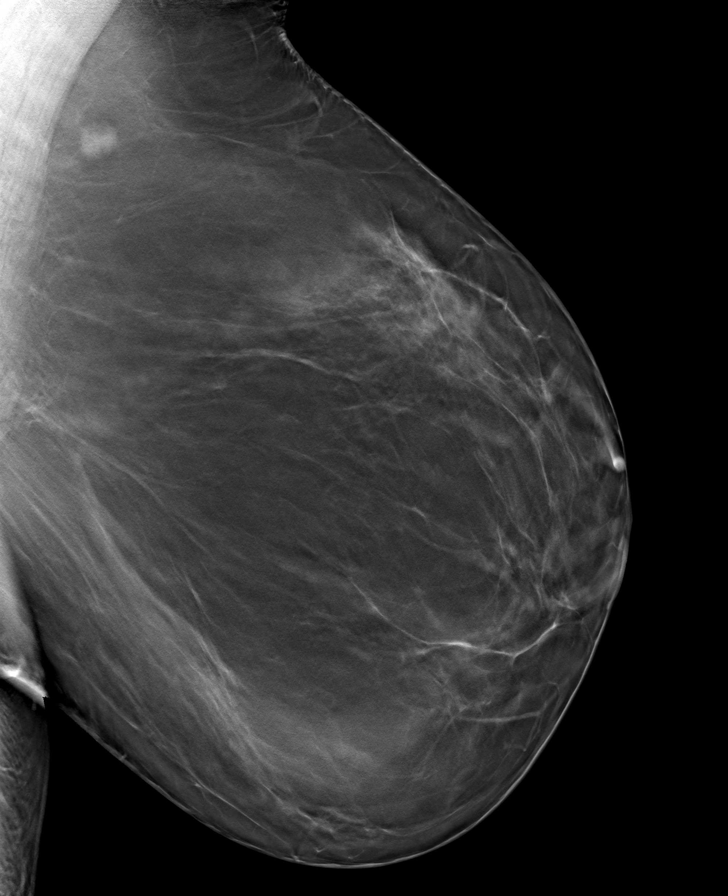

[R MLO tomo · tomo slice 53/106.0]
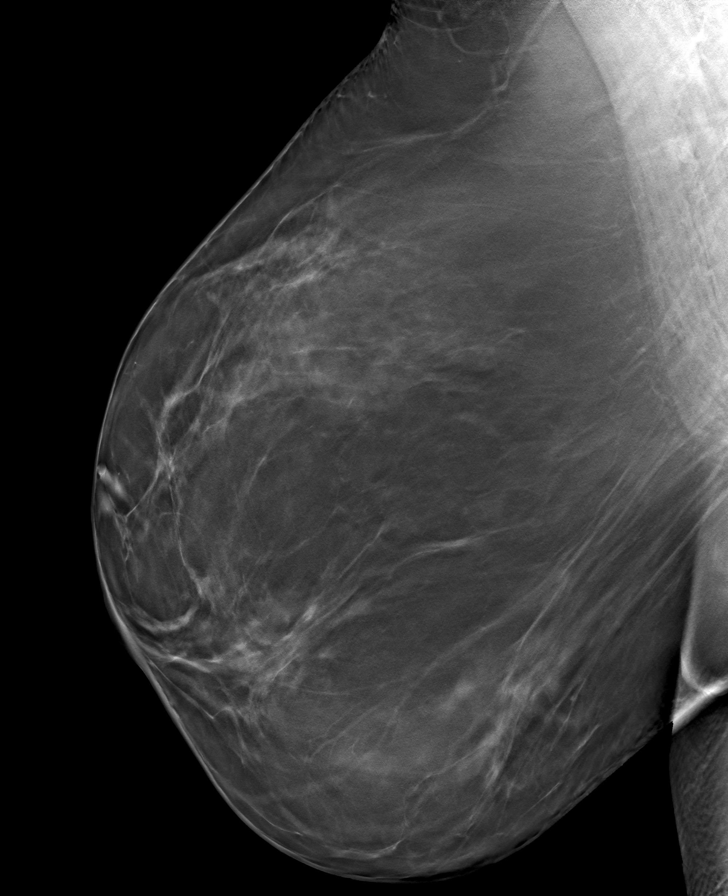

[L CC tomo · tomo slice 46/91.0]
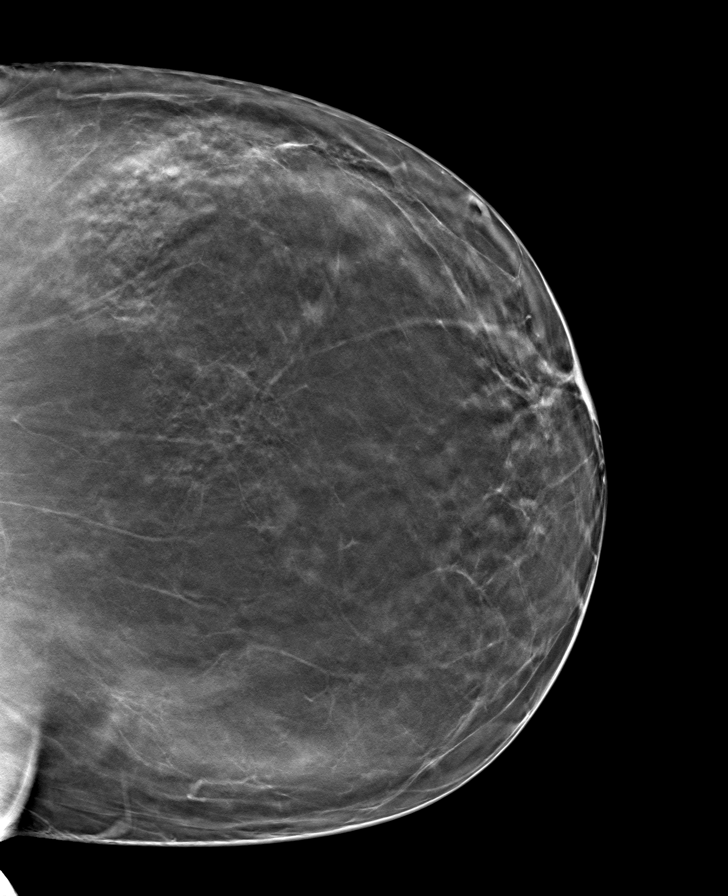

[R CC tomo · tomo slice 48/95.0]
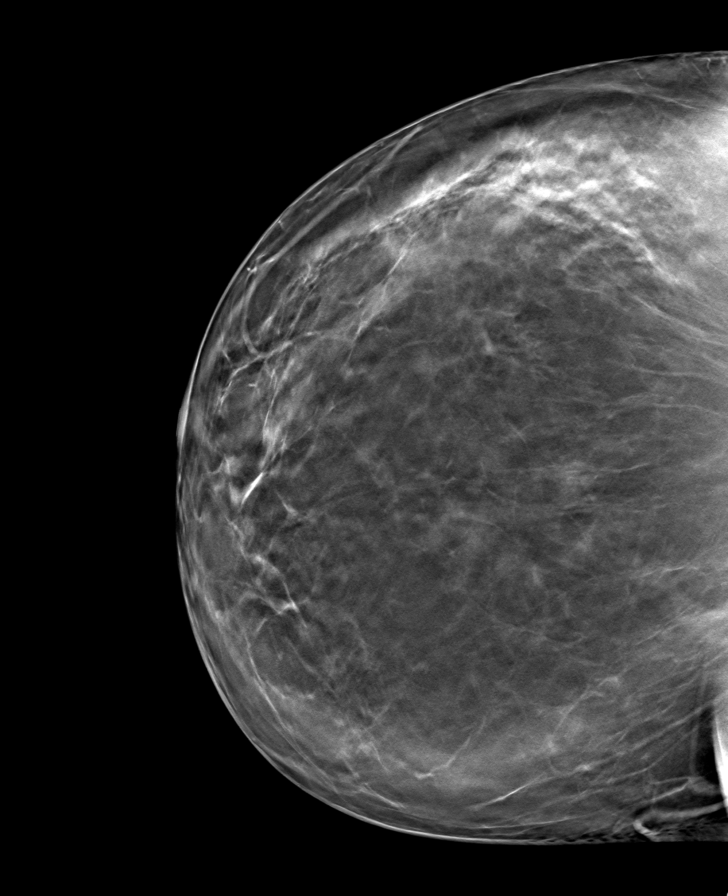

[8 of 24 positions shown; findings below may reference images not displayed]

ACR Breast Density Category b: There are scattered areas of
fibroglandular density.
FINDINGS: In the right breast, an asymmetry with possible distortion warrants
further evaluation. In the left breast, no findings suspicious for
malignancy. Images were processed with CAD.
IMPRESSION: Further evaluation is suggested for and asymmetry with possible
distortion in the right breast.

RECOMMENDATION:
Diagnostic mammogram and possibly ultrasound of the right breast.
(Code:[id number])

The patient will be contacted regarding the findings, and additional
imaging will be scheduled.

BI-RADS CATEGORY  0: Incomplete. Need additional imaging evaluation
and/or prior mammograms for comparison.

## 2021-02-11 ENCOUNTER — Other Ambulatory Visit: Payer: Self-pay

## 2021-02-11 ENCOUNTER — Ambulatory Visit
Admission: RE | Admit: 2021-02-11 | Discharge: 2021-02-11 | Disposition: A | Discharge status: Home / Self Care | Payer: 59 | Source: Ambulatory Visit | Attending: Obstetrics and Gynecology | Admitting: Obstetrics and Gynecology

## 2021-02-11 DIAGNOSIS — Z1231 Encounter for screening mammogram for malignant neoplasm of breast: Secondary | ICD-10-CM

## 2021-02-12 ENCOUNTER — Other Ambulatory Visit: Payer: Self-pay | Admitting: Obstetrics and Gynecology

## 2021-02-12 DIAGNOSIS — R928 Other abnormal and inconclusive findings on diagnostic imaging of breast: Secondary | ICD-10-CM

## 2021-03-06 ENCOUNTER — Ambulatory Visit: Payer: 59

## 2021-03-06 ENCOUNTER — Ambulatory Visit
Admission: RE | Admit: 2021-03-06 | Discharge: 2021-03-06 | Disposition: A | Discharge status: Home / Self Care | Payer: 59 | Source: Ambulatory Visit | Attending: Obstetrics and Gynecology | Admitting: Obstetrics and Gynecology

## 2021-03-06 ENCOUNTER — Other Ambulatory Visit: Payer: Self-pay

## 2021-03-06 DIAGNOSIS — R928 Other abnormal and inconclusive findings on diagnostic imaging of breast: Secondary | ICD-10-CM

## 2022-04-11 ENCOUNTER — Other Ambulatory Visit: Payer: Self-pay | Admitting: Obstetrics and Gynecology

## 2022-04-11 DIAGNOSIS — Z1231 Encounter for screening mammogram for malignant neoplasm of breast: Secondary | ICD-10-CM

## 2022-04-21 ENCOUNTER — Ambulatory Visit
Admission: RE | Admit: 2022-04-21 | Discharge: 2022-04-21 | Disposition: A | Discharge status: Home / Self Care | Payer: 59 | Source: Ambulatory Visit | Attending: Obstetrics and Gynecology | Admitting: Obstetrics and Gynecology

## 2022-04-21 DIAGNOSIS — Z1231 Encounter for screening mammogram for malignant neoplasm of breast: Secondary | ICD-10-CM

## 2022-10-30 ENCOUNTER — Other Ambulatory Visit: Payer: Self-pay | Admitting: Obstetrics and Gynecology

## 2022-10-30 DIAGNOSIS — Z9189 Other specified personal risk factors, not elsewhere classified: Secondary | ICD-10-CM

## 2022-11-17 ENCOUNTER — Ambulatory Visit
Admission: RE | Admit: 2022-11-17 | Discharge: 2022-11-17 | Disposition: A | Discharge status: Home / Self Care | Payer: 59 | Source: Ambulatory Visit | Attending: Obstetrics and Gynecology | Admitting: Obstetrics and Gynecology

## 2022-11-17 DIAGNOSIS — Z9189 Other specified personal risk factors, not elsewhere classified: Secondary | ICD-10-CM

## 2022-11-17 MED ORDER — GADOPICLENOL 0.5 MMOL/ML IV SOLN
10.0000 mL | Freq: Once | INTRAVENOUS | Status: AC | PRN
  Administered 2022-11-17: 10 mL via INTRAVENOUS

## 2022-11-19 ENCOUNTER — Other Ambulatory Visit: Payer: Self-pay | Admitting: Obstetrics and Gynecology

## 2022-11-19 DIAGNOSIS — R928 Other abnormal and inconclusive findings on diagnostic imaging of breast: Secondary | ICD-10-CM

## 2022-11-28 ENCOUNTER — Other Ambulatory Visit: Payer: Self-pay | Admitting: Obstetrics and Gynecology

## 2022-11-28 DIAGNOSIS — K8020 Calculus of gallbladder without cholecystitis without obstruction: Secondary | ICD-10-CM

## 2022-12-01 ENCOUNTER — Encounter: Payer: Self-pay | Admitting: Gastroenterology

## 2023-01-06 NOTE — Progress Notes (Signed)
Regina Carlson:  History: Regina MattersKatherine Crnich Carlson 01/20/2023  Referring provider: Belva AgeeElise Leger, MD (gynecology)  Reason for consult/chief complaint: Cholelithiasis (Seen on MRI)   Subjective  HPI: Patient presents to clinic today for an evaluation of gallstones on MRI per the request of her oncologist after visit to establish care there December 2023. MRI of the breast done for a personal history of fibrotic breast disease and family history of colon cancer.  The scan incidentally discovered gallstones.   Regina DireKate denies chronic abdominal pain.  She has occasional nausea, but thinks this is probably just recent medicines or other sensitivities.  Bowel habits irregular, and also thinks that anxiety may manifest itself that way sometimes.  Denies rectal bleeding.   ROS: Review of Systems Denies chest pain dyspnea or dysuria  Past Medical History: Past Medical History:  Diagnosis Date   Chronic knee pain    Hypertension    PCOS (polycystic ovarian syndrome)    PONV (postoperative nausea and vomiting)    SVT (supraventricular tachycardia)    resolved after cardiac ablation     Past Surgical History: Past Surgical History:  Procedure Laterality Date   BREAST BIOPSY Right 09/07/2019   BREAST EXCISIONAL BIOPSY Right 11/29/2019   CARDIAC ELECTROPHYSIOLOGY MAPPING AND ABLATION  1997   RADIOACTIVE SEED GUIDED EXCISIONAL BREAST BIOPSY Right 11/29/2019   Procedure: RADIOACTIVE SEED GUIDED EXCISIONAL RIGHT BREAST BIOPSY;  Surgeon: Emelia LoronWakefield, Matthew, MD;  Location: [facility];  Service: General;  Laterality: Right;     Family History: Family History  Problem Relation Age of Onset   Heart disease Mother    Cancer Mother    Colon polyps Mother    Prostate cancer Father    Heart disease Father    Thyroid disease Father    Gout Father    Breast cancer Maternal Grandmother    Breast cancer Paternal Grandmother    Colon cancer Neg Hx     Esophageal cancer Neg Hx    Stomach cancer Neg Hx     Social History: Social History   Socioeconomic History   Marital status: Married    Spouse name: Not on file   Number of children: 2   Years of education: Not on file   Highest education level: Not on file  Occupational History   Occupation: Early childhood education  Tobacco Use   Smoking status: Never   Smokeless tobacco: Never  Vaping Use   Vaping Use: Never used  Substance and Sexual Activity   Alcohol use: Yes    Comment: rare   Drug use: Never   Sexual activity: Not on file  Other Topics Concern   Not on file  Social History Narrative   Not on file   Social Determinants of Health   Financial Resource Strain: Not on file  Food Insecurity: Not on file  Transportation Needs: Not on file  Physical Activity: Not on file  Stress: Not on file  Social Connections: Not on file    Allergies: Allergies  Allergen Reactions   Codeine Hives and Nausea And Vomiting   Doxycycline Hives, Itching, Other (See Comments) and Swelling   Penicillins Hives and Nausea And Vomiting    Outpatient Meds: Current Outpatient Medications  Medication Sig Dispense Refill   amLODipine (NORVASC) 5 MG tablet Take 5 mg by mouth daily.     hydrochlorothiazide (MICROZIDE) 12.5 MG capsule Take 12.5 mg by mouth daily.     levonorgestrel (MIRENA) 20 MCG/24HR IUD 1 each by  Intrauterine route once.     No current facility-administered medications for this visit.      ___________________________________________________________________ Objective   Exam:  BP 128/76   Ht 5\' 8"  (1.727 m)   Wt (!) 317 lb 6.4 oz (144 kg)   BMI 48.26 kg/m  Wt Readings from Last 3 Encounters:  01/20/23 (!) 317 lb 6.4 oz (144 kg)  11/29/19 (!) 341 lb 14.9 oz (155.1 kg)    General: well-appearing   Eyes: sclera anicteric, no redness ENT: oral mucosa moist without lesions, no cervical or supraclavicular lymphadenopathy CV: RRR, no JVD, no peripheral  edema Resp: clear to auscultation bilaterally, normal RR and effort noted GI: soft, no tenderness, with active bowel sounds. No guarding or palpable organomegaly noted, somewhat limited by body habitus Skin; warm and dry, no rash or jaundice noted Neuro: awake, alert and oriented x 3. Normal gross motor function and fluent speech  Labs:   Radiologic Studies:  CLINICAL DATA:  Family history of breast cancer including maternal grandmother at age 250. History of RIGHT breast complex sclerosing lesion status post surgical excision in 2021.   EXAM: BILATERAL BREAST MRI WITH AND WITHOUT CONTRAST   TECHNIQUE: Multiplanar, multisequence MR images of both breasts were obtained prior to and following the intravenous administration of 10 ml of Vueway   Three-dimensional MR images were rendered by post-processing of the original MR data on an independent workstation. The three-dimensional MR images were interpreted, and findings are reported in the following complete MRI report for this study. Three dimensional images were evaluated at the independent interpreting workstation using the DynaCAD thin client.   COMPARISON:  No previous breast MRI. Comparison is made to previous mammograms including most recent screening mammogram dated 04/21/2022.   FINDINGS: Breast composition: b. Scattered fibroglandular tissue.   Background parenchymal enhancement: Moderate.   Right breast: There is architectural distortion with associated non-mass enhancement within the lower RIGHT breast, 6 o'clock axis region, measuring 3.3 cm craniocaudal extent, 2.7 cm greatest transverse dimension and approximately 2.2 cm AP dimension, with mixed enhancement kinetics but primarily persistent (sagittal series 100, image 213; axial series 6, images 134 through 158).   No additional suspicious enhancing mass, suspicious non-mass enhancement or secondary signs of malignancy within the RIGHT breast.   Left  breast: No suspicious enhancing mass, suspicious non-mass enhancement or secondary signs of malignancy within the LEFT breast.   Lymph nodes: No abnormal appearing lymph nodes.   Ancillary findings: Multiple gallstones within the nondistended gallbladder, incompletely imaged.   IMPRESSION: 1. Architectural distortion with associated non-mass enhancement within the lower RIGHT breast, 6 o'clock axis region, measuring 3.3 cm greatest dimension. Differential considerations include benign postsurgical fat necrosis mixed with benign breast parenchyma related to patient's surgical excision of a CSL in 2021, additional residual complex sclerosing lesion and concomitant carcinoma. As such, MRI-guided biopsy (2 sites) is recommended to exclude malignancy. 2. No evidence of malignancy within the LEFT breast. 3. Incidental Carlson of cholelithiasis (gallbladder appears to be filled with gallstones).   RECOMMENDATION: 1. TWO SITE MRI-guided biopsies to include the most inferior and most superior aspects of the 3.3 cm extent of suspicious non-mass enhancement in the lower RIGHT breast. These MRI-guided biopsies will be scheduled at patient's convenience. 2. Consider RIGHT upper quadrant ultrasound for further characterization of the gallbladder which appears to be filled with gallstones.   BI-RADS CATEGORY  4: Suspicious.   Electronically Signed: By: Bary RichardStan  Maynard M.D. On: 11/18/2022 10:33  Assessment:  Gallstones  Incidental  asymptomatic gallstones.  I do not recommend cholecystectomy unless she develops symptoms of biliary colic (and I described what that might look like) or acute cholecystitis or pancreatitis, which would typically manifest as severe acute upper abdominal pain.  If she develops symptoms suggestive of biliary colic, she will contact us and we can arrange LFTs, gallbladder ultrasound and referral to surgery.  Colorectal cancer screening average risk after age  63.   Thank you for the courtesy of this consult.  Please call me with any questions or concerns.  Charlie Pitter III  CC: Referring provider noted above

## 2023-01-20 ENCOUNTER — Encounter: Payer: Self-pay | Admitting: Gastroenterology

## 2023-01-20 ENCOUNTER — Ambulatory Visit: Payer: 59 | Admitting: Gastroenterology

## 2023-01-20 VITALS — BP 128/76 | Ht 68.0 in | Wt 317.4 lb

## 2023-01-20 DIAGNOSIS — K8020 Calculus of gallbladder without cholecystitis without obstruction: Secondary | ICD-10-CM

## 2023-01-20 NOTE — Patient Instructions (Signed)
_______________________________________________________  If your blood pressure at your visit was 140/90 or greater, please contact your primary care physician to follow up on this.  _______________________________________________________  If you are age 43 or older, your body mass index should be between 23-30. Your Body mass index is 48.26 kg/m. If this is out of the aforementioned range listed, please consider follow up with your Primary Care Provider.  If you are age 3 or younger, your body mass index should be between 19-25. Your Body mass index is 48.26 kg/m. If this is out of the aformentioned range listed, please consider follow up with your Primary Care Provider.   ________________________________________________________  The [place] [facility] providers would like to encourage you to use [facility] to communicate with providers for non-urgent requests or questions.  Due to long hold times on the telephone, sending your provider a message by [facility] may be a faster and more efficient way to get a response.  Please allow 48 business hours for a response.  Please remember that this is for non-urgent requests.  _______________________________________________________  Follow up as needed.   It was a pleasure to see you today!  Thank you for trusting me with your gastrointestinal care!

## 2023-02-02 ENCOUNTER — Ambulatory Visit
Admission: RE | Admit: 2023-02-02 | Discharge: 2023-02-02 | Disposition: A | Discharge status: Home / Self Care | Payer: 59 | Source: Ambulatory Visit | Attending: Obstetrics and Gynecology | Admitting: Obstetrics and Gynecology

## 2023-02-02 DIAGNOSIS — R928 Other abnormal and inconclusive findings on diagnostic imaging of breast: Secondary | ICD-10-CM

## 2023-02-02 MED ORDER — GADOPICLENOL 0.5 MMOL/ML IV SOLN
10.0000 mL | Freq: Once | INTRAVENOUS | Status: AC | PRN
  Administered 2023-02-02: 10 mL via INTRAVENOUS

## 2023-09-21 ENCOUNTER — Other Ambulatory Visit: Payer: Self-pay | Admitting: Obstetrics and Gynecology

## 2023-09-21 DIAGNOSIS — N6011 Diffuse cystic mastopathy of right breast: Secondary | ICD-10-CM

## 2023-09-22 ENCOUNTER — Other Ambulatory Visit: Payer: Self-pay | Admitting: Obstetrics and Gynecology

## 2023-09-22 DIAGNOSIS — Z9189 Other specified personal risk factors, not elsewhere classified: Secondary | ICD-10-CM

## 2023-09-30 ENCOUNTER — Other Ambulatory Visit: Payer: Self-pay | Admitting: Obstetrics and Gynecology

## 2023-09-30 DIAGNOSIS — N6011 Diffuse cystic mastopathy of right breast: Secondary | ICD-10-CM

## 2023-09-30 DIAGNOSIS — R921 Mammographic calcification found on diagnostic imaging of breast: Secondary | ICD-10-CM

## 2023-10-09 ENCOUNTER — Ambulatory Visit
Admission: RE | Admit: 2023-10-09 | Discharge: 2023-10-09 | Disposition: A | Discharge status: Home / Self Care | Payer: 59 | Source: Ambulatory Visit | Attending: Obstetrics and Gynecology | Admitting: Obstetrics and Gynecology

## 2023-10-09 ENCOUNTER — Ambulatory Visit: Admission: RE | Admit: 2023-10-09 | Payer: 59 | Source: Ambulatory Visit

## 2023-10-09 DIAGNOSIS — N6011 Diffuse cystic mastopathy of right breast: Secondary | ICD-10-CM

## 2023-10-09 DIAGNOSIS — R921 Mammographic calcification found on diagnostic imaging of breast: Secondary | ICD-10-CM

## 2023-11-08 ENCOUNTER — Encounter (HOSPITAL_BASED_OUTPATIENT_CLINIC_OR_DEPARTMENT_OTHER): Payer: Self-pay

## 2023-11-08 ENCOUNTER — Other Ambulatory Visit: Payer: Self-pay

## 2023-11-08 ENCOUNTER — Emergency Department (HOSPITAL_BASED_OUTPATIENT_CLINIC_OR_DEPARTMENT_OTHER)
Admission: EM | Admit: 2023-11-08 | Discharge: 2023-11-08 | Disposition: A | Discharge status: Home / Self Care | Payer: 59 | Attending: Emergency Medicine | Admitting: Emergency Medicine

## 2023-11-08 DIAGNOSIS — Z79899 Other long term (current) drug therapy: Secondary | ICD-10-CM | POA: Diagnosis not present

## 2023-11-08 DIAGNOSIS — M545 Low back pain, unspecified: Secondary | ICD-10-CM | POA: Insufficient documentation

## 2023-11-08 DIAGNOSIS — B029 Zoster without complications: Secondary | ICD-10-CM | POA: Diagnosis not present

## 2023-11-08 MED ORDER — TRAMADOL HCL 50 MG PO TABS: 50.0000 mg | ORAL_TABLET | Freq: Four times a day (QID) | ORAL | 0 refills | Status: AC | PRN

## 2023-11-08 MED ORDER — METHOCARBAMOL 500 MG PO TABS: 500.0000 mg | ORAL_TABLET | Freq: Two times a day (BID) | ORAL | 0 refills | Status: AC

## 2023-11-08 MED ORDER — NAPROXEN 500 MG PO TABS: 500.0000 mg | ORAL_TABLET | Freq: Two times a day (BID) | ORAL | 0 refills | Status: AC

## 2023-11-08 MED ORDER — TRAMADOL HCL 50 MG PO TABS
50.0000 mg | ORAL_TABLET | Freq: Once | ORAL | Status: AC
  Administered 2023-11-08: 50 mg via ORAL
  Filled 2023-11-08: qty 1

## 2023-11-08 MED ORDER — KETOROLAC TROMETHAMINE 30 MG/ML IJ SOLN
30.0000 mg | Freq: Once | INTRAMUSCULAR | Status: AC
  Administered 2023-11-08: 30 mg via INTRAMUSCULAR
  Filled 2023-11-08: qty 1

## 2023-11-08 NOTE — ED Triage Notes (Signed)
Pt states that she was diagnosed with shingles x 5 days ago. Pt has blisters on left lower back and left pelvic region. Pt reports that left back pain/burning has become unbearable since 0100. Pt prescribed antiviral and steroid dose pack.

## 2023-11-08 NOTE — ED Provider Notes (Signed)
[place] EMERGENCY DEPARTMENT AT [facility]  Provider Note  CSN: [id number] Arrival date & time: 11/08/23 4098  History Chief Complaint  Patient presents with   Back Pain    Regina Carlson is a 43 y.o. female reports she was diagnosed with shingles on her L lower back about a week ago, prescribed antivirals and steroids and had been doing better but began to have left lower back/hip pain during the night that she describes as a burning/nerve pain. No radiation down leg. No bowel or bladder problems.    Home Medications Prior to Admission medications   Medication Sig Start Date End Date Taking? Authorizing Provider  methocarbamol (ROBAXIN) 500 MG tablet Take 1 tablet (500 mg total) by mouth 2 (two) times daily. 11/08/23  Yes Pollyann Savoy, MD  naproxen (NAPROSYN) 500 MG tablet Take 1 tablet (500 mg total) by mouth 2 (two) times daily. 11/08/23  Yes Pollyann Savoy, MD  traMADol (ULTRAM) 50 MG tablet Take 1 tablet (50 mg total) by mouth every 6 (six) hours as needed. 11/08/23  Yes Pollyann Savoy, MD  amLODipine (NORVASC) 5 MG tablet Take 5 mg by mouth daily.    [provider]  hydrochlorothiazide (MICROZIDE) 12.5 MG capsule Take 12.5 mg by mouth daily.    [provider]  levonorgestrel (MIRENA) 20 MCG/24HR IUD 1 each by Intrauterine route once.    [provider]     Allergies    Codeine, Doxycycline, and Penicillins   Review of Systems   Review of Systems Please see HPI for pertinent positives and negatives  Physical Exam BP (!) 153/102   Pulse (!) 101   Temp 98.3 F (36.8 C) (Oral)   Resp 15   LMP  (LMP Unknown)   SpO2 100%   Physical Exam Vitals and nursing note reviewed.  Constitutional:      Appearance: Normal appearance.  HENT:     Head: Normocephalic and atraumatic.     Nose: Nose normal.     Mouth/Throat:     Mouth: Mucous membranes are moist.  Eyes:     Extraocular Movements: Extraocular  movements intact.     Conjunctiva/sclera: Conjunctivae normal.  Cardiovascular:     Rate and Rhythm: Normal rate.  Pulmonary:     Effort: Pulmonary effort is normal.     Breath sounds: Normal breath sounds.  Abdominal:     General: Abdomen is flat.     Palpations: Abdomen is soft.     Tenderness: There is no abdominal tenderness.  Musculoskeletal:        General: Tenderness (L lumbar paraspinal area) present. No swelling. Normal range of motion.     Cervical back: Neck supple.  Skin:    General: Skin is warm and dry.     Findings: Rash (shingles rash on L lower back) present.  Neurological:     General: No focal deficit present.     Mental Status: She is alert.  Psychiatric:        Mood and Affect: Mood normal.     ED Results / Procedures / Treatments   EKG None  Procedures Procedures  Medications Ordered in the ED Medications  ketorolac (TORADOL) 30 MG/ML injection 30 mg (has no administration in time range)  traMADol (ULTRAM) tablet 50 mg (has no administration in time range)    Initial Impression and Plan  Patient here with low back pain in area of recent shingles, could be neuropathic pain vs MSK. She  is already on steroids. Will give toradol for pain here. Offered oral pain medications, she states she can only tolerate tramadol. Otherwise recommend rest and PCP follow up, RTED for any other concerns.    ED Course       MDM Rules/Calculators/A&P Medical Decision Making Problems Addressed: Acute left-sided low back pain without sciatica: acute illness or injury Herpes zoster without complication: acute illness or injury  Risk Prescription drug management.     Final Clinical Impression(s) / ED Diagnoses Final diagnoses:  Acute left-sided low back pain without sciatica  Herpes zoster without complication    Rx / DC Orders ED Discharge Orders          Ordered    traMADol (ULTRAM) 50 MG tablet  Every 6 hours PRN        11/08/23 0539    naproxen  (NAPROSYN) 500 MG tablet  2 times daily        11/08/23 0539    methocarbamol (ROBAXIN) 500 MG tablet  2 times daily        11/08/23 0539             Pollyann Savoy, MD 11/08/23 [id number]

## 2024-02-03 ENCOUNTER — Other Ambulatory Visit: Payer: 59
